# Patient Record
Sex: Male | Born: 1980 | Race: White | Hispanic: No | Marital: Single | Smoking: Never smoker
Health system: Southern US, Community
[De-identification: ages and names within clinical notes are randomized; demographics above are authoritative.]

---

## 2005-03-19 ENCOUNTER — Emergency Department: Payer: Self-pay | Admitting: Emergency Medicine

## 2005-03-20 ENCOUNTER — Emergency Department: Payer: Self-pay | Admitting: Emergency Medicine

## 2007-06-13 ENCOUNTER — Emergency Department: Payer: Self-pay | Admitting: Emergency Medicine

## 2008-02-29 ENCOUNTER — Emergency Department: Payer: Self-pay | Admitting: Emergency Medicine

## 2008-03-31 ENCOUNTER — Emergency Department: Payer: Self-pay | Admitting: Internal Medicine

## 2008-04-03 ENCOUNTER — Emergency Department: Payer: Self-pay | Admitting: Emergency Medicine

## 2009-02-20 ENCOUNTER — Ambulatory Visit: Payer: Self-pay | Admitting: General Practice

## 2009-09-17 ENCOUNTER — Emergency Department: Payer: Self-pay | Admitting: Internal Medicine

## 2010-03-02 ENCOUNTER — Emergency Department: Payer: Self-pay | Admitting: Emergency Medicine

## 2010-03-29 ENCOUNTER — Emergency Department: Payer: Self-pay | Admitting: Unknown Physician Specialty

## 2010-09-15 ENCOUNTER — Emergency Department: Payer: Self-pay | Admitting: Emergency Medicine

## 2010-09-22 ENCOUNTER — Emergency Department: Payer: Self-pay | Admitting: Emergency Medicine

## 2010-12-26 ENCOUNTER — Emergency Department: Payer: Self-pay | Admitting: Emergency Medicine

## 2011-05-28 ENCOUNTER — Emergency Department: Payer: Self-pay | Admitting: Emergency Medicine

## 2011-12-06 ENCOUNTER — Emergency Department: Payer: Self-pay | Admitting: *Deleted

## 2011-12-18 ENCOUNTER — Emergency Department: Payer: Self-pay | Admitting: Emergency Medicine

## 2012-01-22 ENCOUNTER — Emergency Department: Payer: Self-pay

## 2012-02-03 ENCOUNTER — Emergency Department: Payer: Self-pay | Admitting: Emergency Medicine

## 2012-02-03 LAB — CBC
HCT: 42.9 % (ref 40.0–52.0)
HGB: 14.7 g/dL (ref 13.0–18.0)
MCH: 31.8 pg (ref 26.0–34.0)
MCV: 93 fL (ref 80–100)
RBC: 4.64 10*6/uL (ref 4.40–5.90)
RDW: 13 % (ref 11.5–14.5)
WBC: 2.6 10*3/uL — ABNORMAL LOW (ref 3.8–10.6)

## 2012-02-03 LAB — BASIC METABOLIC PANEL
Anion Gap: 8 (ref 7–16)
Calcium, Total: 8.2 mg/dL — ABNORMAL LOW (ref 8.5–10.1)
Chloride: 99 mmol/L (ref 98–107)
Co2: 27 mmol/L (ref 21–32)
Creatinine: 1.27 mg/dL (ref 0.60–1.30)
Glucose: 116 mg/dL — ABNORMAL HIGH (ref 65–99)
Osmolality: 268 (ref 275–301)
Potassium: 3.8 mmol/L (ref 3.5–5.1)
Sodium: 134 mmol/L — ABNORMAL LOW (ref 136–145)

## 2012-02-05 ENCOUNTER — Emergency Department: Payer: Self-pay | Admitting: *Deleted

## 2012-02-05 LAB — URINALYSIS, COMPLETE
Bilirubin,UR: NEGATIVE
Blood: NEGATIVE
Glucose,UR: NEGATIVE mg/dL (ref 0–75)
Ketone: NEGATIVE
Leukocyte Esterase: NEGATIVE
RBC,UR: NONE SEEN /HPF (ref 0–5)
Specific Gravity: 1.025 (ref 1.003–1.030)
Squamous Epithelial: NONE SEEN
WBC UR: <1 /HPF (ref 0–5)

## 2012-06-07 ENCOUNTER — Emergency Department: Payer: Self-pay | Admitting: Emergency Medicine

## 2013-11-10 ENCOUNTER — Emergency Department: Payer: Self-pay | Admitting: Emergency Medicine

## 2013-11-10 LAB — CBC
HCT: 46.6 % (ref 40.0–52.0)
HGB: 15.8 g/dL (ref 13.0–18.0)
MCH: 31.2 pg (ref 26.0–34.0)
MCHC: 34 g/dL (ref 32.0–36.0)
MCV: 92 fL (ref 80–100)
Platelet: 261 10*3/uL (ref 150–440)
RBC: 5.08 10*6/uL (ref 4.40–5.90)
RDW: 13.7 % (ref 11.5–14.5)
WBC: 10.3 10*3/uL (ref 3.8–10.6)

## 2013-11-10 LAB — COMPREHENSIVE METABOLIC PANEL
ALT: 25 U/L (ref 12–78)
Albumin: 4.4 g/dL (ref 3.4–5.0)
Alkaline Phosphatase: 70 U/L
Anion Gap: 4 — ABNORMAL LOW (ref 7–16)
BUN: 15 mg/dL (ref 7–18)
Bilirubin,Total: 1.5 mg/dL — ABNORMAL HIGH (ref 0.2–1.0)
CALCIUM: 9.2 mg/dL (ref 8.5–10.1)
CO2: 32 mmol/L (ref 21–32)
CREATININE: 1.64 mg/dL — AB (ref 0.60–1.30)
Chloride: 101 mmol/L (ref 98–107)
EGFR (African American): >60
EGFR (Non-African Amer.): 54 — ABNORMAL LOW
Glucose: 121 mg/dL — ABNORMAL HIGH (ref 65–99)
Osmolality: 276 (ref 275–301)
POTASSIUM: 3.4 mmol/L — AB (ref 3.5–5.1)
SGOT(AST): 35 U/L (ref 15–37)
Sodium: 137 mmol/L (ref 136–145)
TOTAL PROTEIN: 7.5 g/dL (ref 6.4–8.2)

## 2013-11-10 LAB — URINALYSIS, COMPLETE
BACTERIA: NONE SEEN
BILIRUBIN, UR: NEGATIVE
Blood: NEGATIVE
Glucose,UR: NEGATIVE mg/dL (ref 0–75)
Leukocyte Esterase: NEGATIVE
NITRITE: NEGATIVE
Ph: 5 (ref 4.5–8.0)
Protein: 30
RBC, UR: NONE SEEN /HPF (ref 0–5)
Specific Gravity: 1.023 (ref 1.003–1.030)
Squamous Epithelial: <1
WBC UR: NONE SEEN /HPF (ref 0–5)

## 2013-11-10 LAB — DRUG SCREEN, URINE
AMPHETAMINES, UR SCREEN: NEGATIVE (ref ?–1000)
BARBITURATES, UR SCREEN: NEGATIVE (ref ?–200)
BENZODIAZEPINE, UR SCRN: POSITIVE (ref ?–200)
CANNABINOID 50 NG, UR ~~LOC~~: POSITIVE (ref ?–50)
COCAINE METABOLITE, UR ~~LOC~~: POSITIVE (ref ?–300)
MDMA (Ecstasy)Ur Screen: NEGATIVE (ref ?–500)
METHADONE, UR SCREEN: NEGATIVE (ref ?–300)
Opiate, Ur Screen: NEGATIVE (ref ?–300)
Phencyclidine (PCP) Ur S: NEGATIVE (ref ?–25)
Tricyclic, Ur Screen: NEGATIVE (ref ?–1000)

## 2013-11-10 LAB — SALICYLATE LEVEL

## 2013-11-10 LAB — ACETAMINOPHEN LEVEL: Acetaminophen: <2 ug/mL

## 2013-11-10 LAB — ETHANOL: Ethanol %: <0.003 % (ref 0.000–0.080)

## 2013-11-10 LAB — TSH: Thyroid Stimulating Horm: 6.1 u[IU]/mL — ABNORMAL HIGH
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
## 2014-12-25 ENCOUNTER — Emergency Department
Admission: EM | Admit: 2014-12-25 | Discharge: 2014-12-25 | Payer: No Typology Code available for payment source | Attending: Emergency Medicine | Admitting: Emergency Medicine

## 2014-12-25 ENCOUNTER — Emergency Department: Payer: No Typology Code available for payment source

## 2014-12-25 ENCOUNTER — Encounter: Payer: Self-pay | Admitting: Emergency Medicine

## 2014-12-25 DIAGNOSIS — S022XXA Fracture of nasal bones, initial encounter for closed fracture: Secondary | ICD-10-CM | POA: Insufficient documentation

## 2014-12-25 DIAGNOSIS — S028XXA Fractures of other specified skull and facial bones, initial encounter for closed fracture: Secondary | ICD-10-CM | POA: Insufficient documentation

## 2014-12-25 DIAGNOSIS — Y9389 Activity, other specified: Secondary | ICD-10-CM | POA: Insufficient documentation

## 2014-12-25 DIAGNOSIS — Y998 Other external cause status: Secondary | ICD-10-CM | POA: Diagnosis not present

## 2014-12-25 DIAGNOSIS — S0292XA Unspecified fracture of facial bones, initial encounter for closed fracture: Secondary | ICD-10-CM

## 2014-12-25 DIAGNOSIS — S02401A Maxillary fracture, unspecified, initial encounter for closed fracture: Secondary | ICD-10-CM | POA: Diagnosis not present

## 2014-12-25 DIAGNOSIS — Y9289 Other specified places as the place of occurrence of the external cause: Secondary | ICD-10-CM | POA: Diagnosis not present

## 2014-12-25 DIAGNOSIS — S0993XA Unspecified injury of face, initial encounter: Secondary | ICD-10-CM | POA: Diagnosis present

## 2014-12-25 DIAGNOSIS — S023XXA Fracture of orbital floor, initial encounter for closed fracture: Secondary | ICD-10-CM | POA: Insufficient documentation

## 2014-12-25 DIAGNOSIS — S02402A Zygomatic fracture, unspecified, initial encounter for closed fracture: Secondary | ICD-10-CM | POA: Diagnosis not present

## 2014-12-25 LAB — COMPREHENSIVE METABOLIC PANEL
ALBUMIN: 4.7 g/dL (ref 3.5–5.0)
ALT: 26 U/L (ref 17–63)
ANION GAP: 9 (ref 5–15)
AST: 30 U/L (ref 15–41)
Alkaline Phosphatase: 58 U/L (ref 38–126)
BILIRUBIN TOTAL: 1.5 mg/dL — AB (ref 0.3–1.2)
BUN: 16 mg/dL (ref 6–20)
CHLORIDE: 106 mmol/L (ref 101–111)
CO2: 26 mmol/L (ref 22–32)
Calcium: 9.1 mg/dL (ref 8.9–10.3)
Creatinine, Ser: 1.41 mg/dL — ABNORMAL HIGH (ref 0.61–1.24)
GFR calc Af Amer: >60 mL/min (ref >60)
GFR calc non Af Amer: >60 mL/min (ref >60)
GLUCOSE: 124 mg/dL — AB (ref 65–99)
Potassium: 4 mmol/L (ref 3.5–5.1)
SODIUM: 141 mmol/L (ref 135–145)
Total Protein: 7.4 g/dL (ref 6.5–8.1)

## 2014-12-25 LAB — CBC
HCT: 46.7 % (ref 40.0–52.0)
Hemoglobin: 16 g/dL (ref 13.0–18.0)
MCH: 30.9 pg (ref 26.0–34.0)
MCHC: 34.2 g/dL (ref 32.0–36.0)
MCV: 90.4 fL (ref 80.0–100.0)
PLATELETS: 264 10*3/uL (ref 150–440)
RBC: 5.16 MIL/uL (ref 4.40–5.90)
RDW: 14 % (ref 11.5–14.5)
WBC: 18.7 10*3/uL — ABNORMAL HIGH (ref 3.8–10.6)

## 2014-12-25 LAB — ETHANOL: Alcohol, Ethyl (B): <5 mg/dL (ref <5)

## 2014-12-25 MED ORDER — MORPHINE SULFATE 4 MG/ML IJ SOLN
INTRAMUSCULAR | Status: AC
  Administered 2014-12-25: 4 mg via INTRAVENOUS
  Filled 2014-12-25: qty 1

## 2014-12-25 MED ORDER — HYDROMORPHONE HCL 1 MG/ML IJ SOLN
INTRAMUSCULAR | Status: AC
Start: 2014-12-25 — End: 2014-12-25
  Administered 2014-12-25: 1 mg via INTRAVENOUS
  Filled 2014-12-25: qty 1

## 2014-12-25 MED ORDER — SODIUM CHLORIDE 0.9 % IV SOLN
3.0000 g | Freq: Once | INTRAVENOUS | Status: AC
  Administered 2014-12-25: 3 g via INTRAVENOUS

## 2014-12-25 MED ORDER — MORPHINE SULFATE 4 MG/ML IJ SOLN
4.0000 mg | Freq: Once | INTRAMUSCULAR | Status: AC
  Administered 2014-12-25: 4 mg via INTRAVENOUS

## 2014-12-25 MED ORDER — ONDANSETRON HCL 4 MG/2ML IJ SOLN
INTRAMUSCULAR | Status: AC
  Administered 2014-12-25: 4 mg via INTRAVENOUS
  Filled 2014-12-25: qty 2

## 2014-12-25 MED ORDER — HYDROMORPHONE HCL 1 MG/ML IJ SOLN
1.0000 mg | Freq: Once | INTRAMUSCULAR | Status: AC
  Administered 2014-12-25: 1 mg via INTRAVENOUS

## 2014-12-25 MED ORDER — AMPICILLIN-SULBACTAM SODIUM 3 (2-1) G IJ SOLR
INTRAMUSCULAR | Status: AC
  Administered 2014-12-25: 3 g via INTRAVENOUS
  Filled 2014-12-25: qty 3

## 2014-12-25 MED ORDER — SODIUM CHLORIDE 0.9 % IV BOLUS (SEPSIS)
1000.0000 mL | Freq: Once | INTRAVENOUS | Status: AC
  Administered 2014-12-25: 1000 mL via INTRAVENOUS

## 2014-12-25 MED ORDER — ONDANSETRON HCL 4 MG/2ML IJ SOLN
4.0000 mg | Freq: Once | INTRAMUSCULAR | Status: AC
Start: 2014-12-25 — End: 2014-12-25
  Administered 2014-12-25: 4 mg via INTRAVENOUS
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
## 2015-12-06 ENCOUNTER — Encounter: Payer: Self-pay | Admitting: Radiology

## 2015-12-06 ENCOUNTER — Emergency Department: Payer: Self-pay

## 2015-12-06 ENCOUNTER — Emergency Department
Admission: EM | Admit: 2015-12-06 | Discharge: 2015-12-06 | Disposition: A | Discharge status: Home / Self Care | Payer: Self-pay | Attending: Emergency Medicine | Admitting: Emergency Medicine

## 2015-12-06 DIAGNOSIS — F1210 Cannabis abuse, uncomplicated: Secondary | ICD-10-CM

## 2015-12-06 DIAGNOSIS — S20212A Contusion of left front wall of thorax, initial encounter: Secondary | ICD-10-CM | POA: Insufficient documentation

## 2015-12-06 DIAGNOSIS — S2232XA Fracture of one rib, left side, initial encounter for closed fracture: Secondary | ICD-10-CM | POA: Insufficient documentation

## 2015-12-06 DIAGNOSIS — S20229A Contusion of unspecified back wall of thorax, initial encounter: Secondary | ICD-10-CM | POA: Insufficient documentation

## 2015-12-06 DIAGNOSIS — S9031XA Contusion of right foot, initial encounter: Secondary | ICD-10-CM | POA: Insufficient documentation

## 2015-12-06 DIAGNOSIS — Y999 Unspecified external cause status: Secondary | ICD-10-CM | POA: Insufficient documentation

## 2015-12-06 DIAGNOSIS — T07XXXA Unspecified multiple injuries, initial encounter: Secondary | ICD-10-CM

## 2015-12-06 DIAGNOSIS — S0083XA Contusion of other part of head, initial encounter: Secondary | ICD-10-CM | POA: Insufficient documentation

## 2015-12-06 DIAGNOSIS — S9032XA Contusion of left foot, initial encounter: Secondary | ICD-10-CM | POA: Insufficient documentation

## 2015-12-06 DIAGNOSIS — Y939 Activity, unspecified: Secondary | ICD-10-CM | POA: Insufficient documentation

## 2015-12-06 DIAGNOSIS — Y929 Unspecified place or not applicable: Secondary | ICD-10-CM | POA: Insufficient documentation

## 2015-12-06 DIAGNOSIS — F1490 Cocaine use, unspecified, uncomplicated: Secondary | ICD-10-CM

## 2015-12-06 DIAGNOSIS — S8012XA Contusion of left lower leg, initial encounter: Secondary | ICD-10-CM | POA: Insufficient documentation

## 2015-12-06 DIAGNOSIS — S40022A Contusion of left upper arm, initial encounter: Secondary | ICD-10-CM | POA: Insufficient documentation

## 2015-12-06 LAB — COMPREHENSIVE METABOLIC PANEL
ALT: 21 U/L (ref 17–63)
ANION GAP: 4 — AB (ref 5–15)
AST: 36 U/L (ref 15–41)
Albumin: 3.9 g/dL (ref 3.5–5.0)
Alkaline Phosphatase: 53 U/L (ref 38–126)
BUN: 23 mg/dL — ABNORMAL HIGH (ref 6–20)
CO2: 28 mmol/L (ref 22–32)
Calcium: 8.7 mg/dL — ABNORMAL LOW (ref 8.9–10.3)
Chloride: 105 mmol/L (ref 101–111)
Creatinine, Ser: 1.78 mg/dL — ABNORMAL HIGH (ref 0.61–1.24)
GFR calc Af Amer: 55 mL/min — ABNORMAL LOW (ref >60)
GFR calc non Af Amer: 47 mL/min — ABNORMAL LOW (ref >60)
GLUCOSE: 86 mg/dL (ref 65–99)
Potassium: 3.6 mmol/L (ref 3.5–5.1)
SODIUM: 137 mmol/L (ref 135–145)
Total Bilirubin: 0.7 mg/dL (ref 0.3–1.2)
Total Protein: 6 g/dL — ABNORMAL LOW (ref 6.5–8.1)

## 2015-12-06 LAB — CBC WITH DIFFERENTIAL/PLATELET
BASOS PCT: 1 %
Basophils Absolute: 0.1 10*3/uL (ref 0–0.1)
EOS ABS: 0.1 10*3/uL (ref 0–0.7)
EOS PCT: 0 %
HCT: 39.5 % — ABNORMAL LOW (ref 40.0–52.0)
Hemoglobin: 13.8 g/dL (ref 13.0–18.0)
Lymphocytes Relative: 5 %
Lymphs Abs: 1 10*3/uL (ref 1.0–3.6)
MCH: 31.2 pg (ref 26.0–34.0)
MCHC: 35 g/dL (ref 32.0–36.0)
MCV: 89.3 fL (ref 80.0–100.0)
MONO ABS: 0.9 10*3/uL (ref 0.2–1.0)
MONOS PCT: 5 %
Neutro Abs: 16.2 10*3/uL — ABNORMAL HIGH (ref 1.4–6.5)
Neutrophils Relative %: 89 %
Platelets: 224 10*3/uL (ref 150–440)
RBC: 4.43 MIL/uL (ref 4.40–5.90)
RDW: 13.7 % (ref 11.5–14.5)
WBC: 18.2 10*3/uL — ABNORMAL HIGH (ref 3.8–10.6)

## 2015-12-06 LAB — URINE DRUG SCREEN, QUALITATIVE (ARMC ONLY)
Amphetamines, Ur Screen: NOT DETECTED
Barbiturates, Ur Screen: NOT DETECTED
Benzodiazepine, Ur Scrn: NOT DETECTED
Cannabinoid 50 Ng, Ur ~~LOC~~: POSITIVE — AB
Cocaine Metabolite,Ur ~~LOC~~: POSITIVE — AB
MDMA (ECSTASY) UR SCREEN: NOT DETECTED
METHADONE SCREEN, URINE: NOT DETECTED
OPIATE, UR SCREEN: NOT DETECTED
PHENCYCLIDINE (PCP) UR S: NOT DETECTED
Tricyclic, Ur Screen: NOT DETECTED

## 2015-12-06 LAB — URINALYSIS COMPLETE WITH MICROSCOPIC (ARMC ONLY)
BILIRUBIN URINE: NEGATIVE
Bacteria, UA: NONE SEEN
Glucose, UA: NEGATIVE mg/dL
HGB URINE DIPSTICK: NEGATIVE
Ketones, ur: NEGATIVE mg/dL
Leukocytes, UA: NEGATIVE
NITRITE: NEGATIVE
PH: 5 (ref 5.0–8.0)
Protein, ur: NEGATIVE mg/dL
SPECIFIC GRAVITY, URINE: 1.048 — AB (ref 1.005–1.030)
SQUAMOUS EPITHELIAL / LPF: NONE SEEN

## 2015-12-06 LAB — ETHANOL: Alcohol, Ethyl (B): <5 mg/dL (ref <5)

## 2015-12-06 LAB — PROTIME-INR
INR: 1.04
Prothrombin Time: 13.8 seconds (ref 11.4–15.0)

## 2015-12-06 MED ORDER — HYDROMORPHONE HCL 1 MG/ML IJ SOLN
INTRAMUSCULAR | Status: AC
  Administered 2015-12-06: 1 mg via INTRAVENOUS
  Filled 2015-12-06: qty 1

## 2015-12-06 MED ORDER — OXYCODONE-ACETAMINOPHEN 5-325 MG PO TABS: 1.0000 | ORAL_TABLET | ORAL | Status: AC | PRN

## 2015-12-06 MED ORDER — SODIUM CHLORIDE 0.9 % IV BOLUS (SEPSIS)
1000.0000 mL | Freq: Once | INTRAVENOUS | Status: AC
  Administered 2015-12-06: 1000 mL via INTRAVENOUS

## 2015-12-06 MED ORDER — DOCUSATE SODIUM 100 MG PO CAPS: ORAL_CAPSULE | ORAL | Status: AC

## 2015-12-06 MED ORDER — HYDROMORPHONE HCL 1 MG/ML IJ SOLN
1.0000 mg | Freq: Once | INTRAMUSCULAR | Status: AC
  Administered 2015-12-06: 1 mg via INTRAVENOUS

## 2015-12-06 MED ORDER — IOPAMIDOL (ISOVUE-370) INJECTION 76%
100.0000 mL | Freq: Once | INTRAVENOUS | Status: AC | PRN
  Administered 2015-12-06: 100 mL via INTRAVENOUS

## 2015-12-06 MED ORDER — ONDANSETRON HCL 4 MG/2ML IJ SOLN
4.0000 mg | Freq: Once | INTRAMUSCULAR | Status: AC
  Administered 2015-12-06: 4 mg via INTRAVENOUS

## 2015-12-06 MED ORDER — ONDANSETRON HCL 4 MG/2ML IJ SOLN
INTRAMUSCULAR | Status: AC
  Administered 2015-12-06: 4 mg via INTRAVENOUS
  Filled 2015-12-06: qty 2

## 2015-12-06 MED ORDER — OXYCODONE-ACETAMINOPHEN 5-325 MG PO TABS
2.0000 | ORAL_TABLET | Freq: Once | ORAL | Status: AC
  Administered 2015-12-06: 2 via ORAL
  Filled 2015-12-06: qty 2

## 2015-12-06 MED ORDER — SODIUM CHLORIDE 0.9 % IV BOLUS (SEPSIS)
1000.0000 mL | INTRAVENOUS | Status: AC
  Administered 2015-12-06: 1000 mL via INTRAVENOUS

## 2015-12-06 NOTE — ED Notes (Signed)
Patient transported to CT via stretcher.
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
## 2015-12-06 NOTE — ED Notes (Signed)
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
## 2015-12-06 NOTE — ED Notes (Signed)
Patient transported to X-ray via stretcher 

## 2017-03-11 ENCOUNTER — Encounter: Payer: Self-pay | Admitting: Emergency Medicine

## 2017-03-11 ENCOUNTER — Emergency Department
Admission: EM | Admit: 2017-03-11 | Discharge: 2017-03-11 | Disposition: A | Discharge status: Home / Self Care | Payer: Self-pay | Attending: Emergency Medicine | Admitting: Emergency Medicine

## 2017-03-11 DIAGNOSIS — M25511 Pain in right shoulder: Secondary | ICD-10-CM | POA: Insufficient documentation

## 2017-03-11 MED ORDER — KETOROLAC TROMETHAMINE 60 MG/2ML IM SOLN
60.0000 mg | Freq: Once | INTRAMUSCULAR | Status: AC
  Administered 2017-03-11: 60 mg via INTRAMUSCULAR

## 2017-03-11 MED ORDER — TRAMADOL HCL 50 MG PO TABS: 50.0000 mg | ORAL_TABLET | Freq: Four times a day (QID) | ORAL | 0 refills | Status: AC | PRN

## 2017-03-11 MED ORDER — KETOROLAC TROMETHAMINE 60 MG/2ML IM SOLN
INTRAMUSCULAR | Status: AC
  Filled 2017-03-11: qty 2

## 2017-03-11 NOTE — ED Triage Notes (Signed)
Pt ambulatory to triage without difficulty, pt very restless; st [facility] and not feeling any better; st was helping someone carry a transmission and it slipped injuring his right shoulder
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
## 2017-03-11 NOTE — ED Notes (Signed)

## 2017-03-26 ENCOUNTER — Emergency Department: Payer: Self-pay

## 2017-03-26 ENCOUNTER — Emergency Department
Admission: EM | Admit: 2017-03-26 | Discharge: 2017-03-26 | Disposition: A | Discharge status: Home / Self Care | Payer: Self-pay | Attending: Emergency Medicine | Admitting: Emergency Medicine

## 2017-03-26 ENCOUNTER — Encounter: Payer: Self-pay | Admitting: Emergency Medicine

## 2017-03-26 DIAGNOSIS — R202 Paresthesia of skin: Secondary | ICD-10-CM | POA: Insufficient documentation

## 2017-03-26 DIAGNOSIS — M5412 Radiculopathy, cervical region: Secondary | ICD-10-CM | POA: Insufficient documentation

## 2017-03-26 MED ORDER — DEXAMETHASONE SODIUM PHOSPHATE 10 MG/ML IJ SOLN
10.0000 mg | Freq: Once | INTRAMUSCULAR | Status: AC
  Administered 2017-03-26: 10 mg via INTRAMUSCULAR
  Filled 2017-03-26: qty 1

## 2017-03-26 MED ORDER — OXYCODONE-ACETAMINOPHEN 5-325 MG PO TABS: 1.0000 | ORAL_TABLET | Freq: Three times a day (TID) | ORAL | 0 refills | Status: AC | PRN

## 2017-03-26 MED ORDER — KETOROLAC TROMETHAMINE 60 MG/2ML IM SOLN
60.0000 mg | Freq: Once | INTRAMUSCULAR | Status: AC
  Administered 2017-03-26: 60 mg via INTRAMUSCULAR
  Filled 2017-03-26: qty 2

## 2017-03-26 MED ORDER — PREDNISONE 10 MG (21) PO TBPK: ORAL_TABLET | Freq: Every day | ORAL | 0 refills | Status: AC
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
## 2017-03-26 NOTE — ED Notes (Signed)
Pt taken to X-ray, visualized as ambulatory with no difficulty.

## 2017-03-26 NOTE — ED Notes (Signed)
NAD noted at time of D/C. Pt denies questions or concerns. Pt ambulatory to the lobby at this time.  

## 2017-03-26 NOTE — ED Notes (Signed)
Dr. Williams at bedside at this time.  

## 2017-03-26 NOTE — ED Triage Notes (Signed)
Patient ambulatory to triage with steady gait, without difficulty or distress noted; pt reports rt shoulder pain; has been seen multiple times for same

## 2019-06-06 IMAGING — CR DG SHOULDER 2+V*R*
3 series · 3 of 3 positions shown · non-contrast
Comparison: None.

CLINICAL DATA: Right shoulder pain after moving furniture 1 month
ago.

EXAM:
RIGHT SHOULDER - 2+ VIEW

[shoulder grashey (1 of 2)]
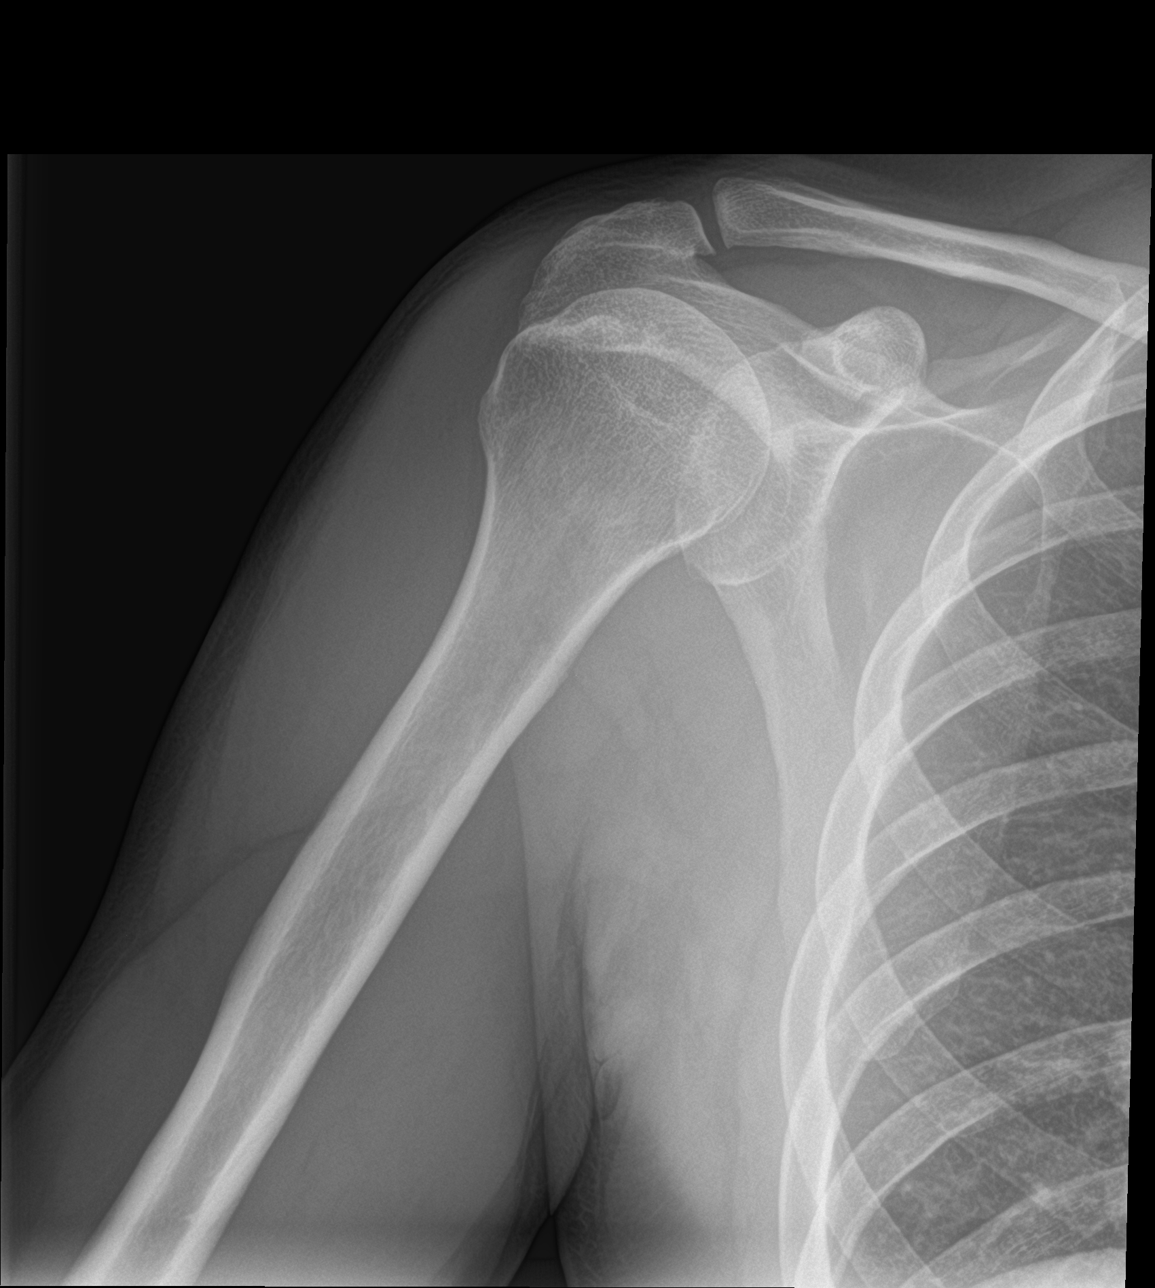

[shoulder y view]
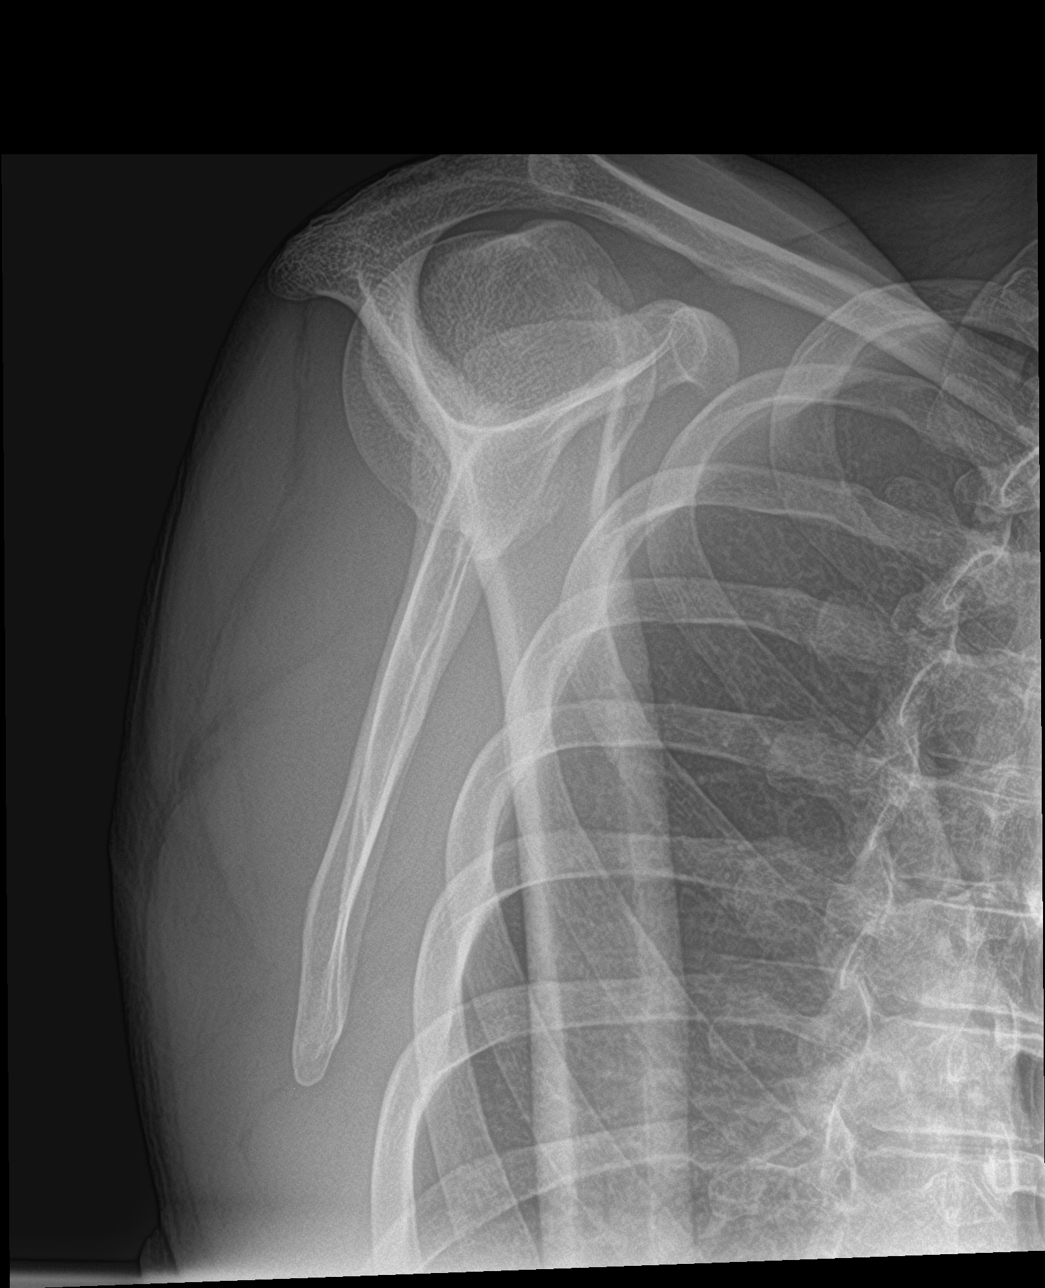

[shoulder grashey (2 of 2)]
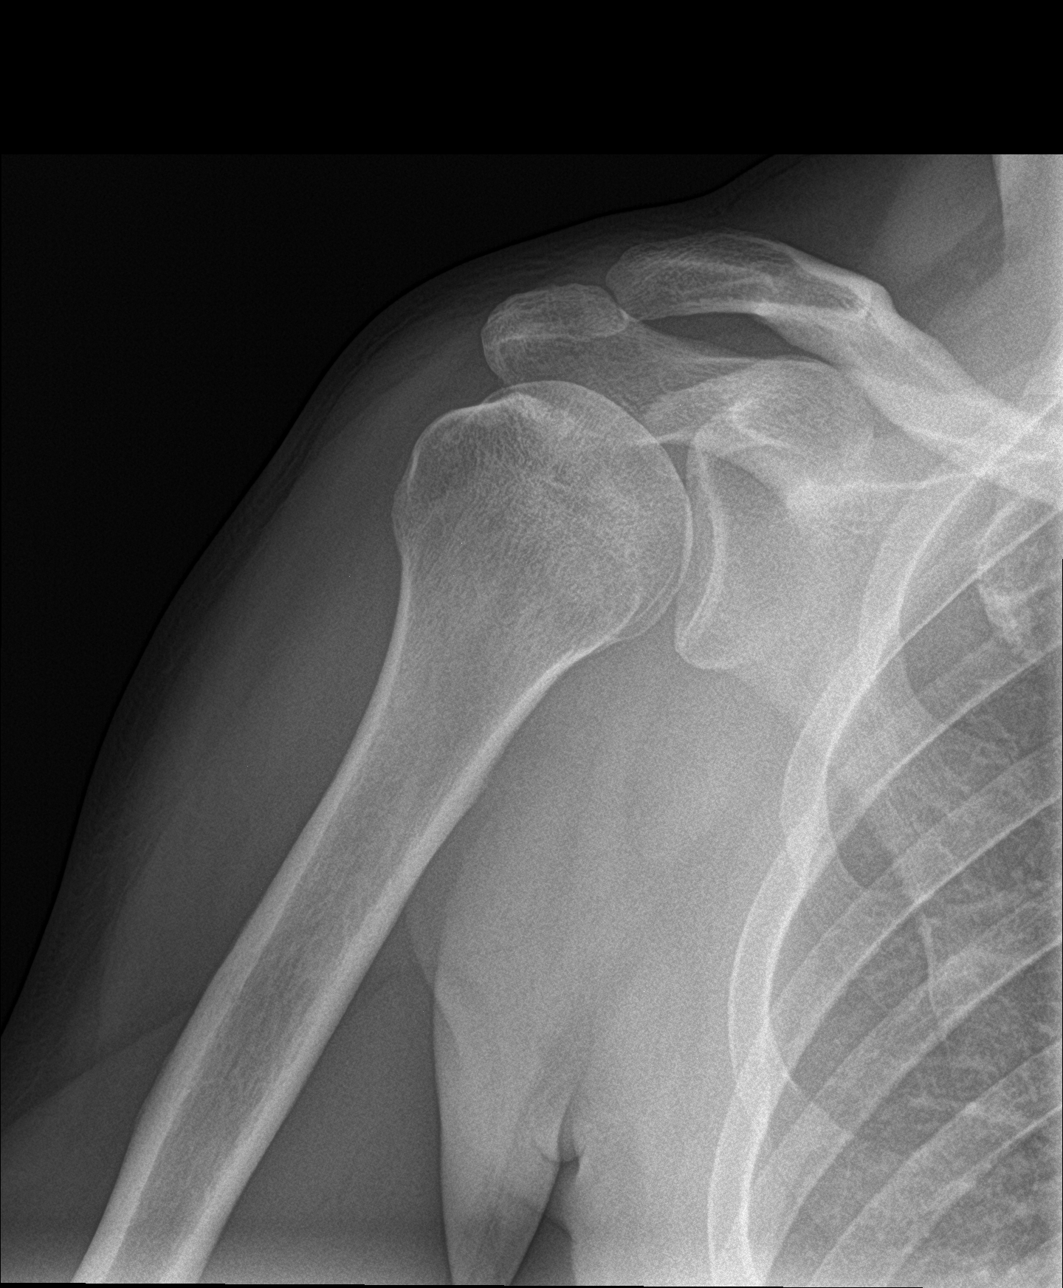

[3 of 3 positions shown; findings below may reference images not displayed]

FINDINGS: There is no evidence of fracture or dislocation. There is no
evidence of arthropathy or other focal bone abnormality. Soft
tissues are unremarkable.
IMPRESSION: Normal right shoulder.

## 2020-03-08 ENCOUNTER — Other Ambulatory Visit: Payer: Self-pay

## 2020-03-08 ENCOUNTER — Emergency Department
Admission: EM | Admit: 2020-03-08 | Discharge: 2020-03-08 | Disposition: A | Discharge status: Home / Self Care | Payer: Self-pay | Attending: Emergency Medicine | Admitting: Emergency Medicine

## 2020-03-08 ENCOUNTER — Encounter: Payer: Self-pay | Admitting: Emergency Medicine

## 2020-03-08 DIAGNOSIS — S0096XA Insect bite (nonvenomous) of unspecified part of head, initial encounter: Secondary | ICD-10-CM | POA: Insufficient documentation

## 2020-03-08 DIAGNOSIS — W57XXXA Bitten or stung by nonvenomous insect and other nonvenomous arthropods, initial encounter: Secondary | ICD-10-CM | POA: Insufficient documentation

## 2020-03-08 DIAGNOSIS — Y999 Unspecified external cause status: Secondary | ICD-10-CM | POA: Insufficient documentation

## 2020-03-08 DIAGNOSIS — Y939 Activity, unspecified: Secondary | ICD-10-CM | POA: Insufficient documentation

## 2020-03-08 DIAGNOSIS — Y929 Unspecified place or not applicable: Secondary | ICD-10-CM | POA: Insufficient documentation

## 2020-03-08 DIAGNOSIS — Z5321 Procedure and treatment not carried out due to patient leaving prior to being seen by health care provider: Secondary | ICD-10-CM | POA: Insufficient documentation

## 2020-03-08 NOTE — ED Triage Notes (Signed)
Pt in via EMS from home with c/o bugs living inside of his body. Pt was seen at [facility] yesterday for the same. Pt also claws at skin to get the bugs out.

## 2020-03-08 NOTE — ED Triage Notes (Addendum)
C/O "something" moved from nose and ears and into lungs.  Patient states he coughed some of 'it' up, now states something is 'stuck' in esophagus.  Also c/o "something" in left ear and left side of nose.  Voice clear and strong.  NAD.  Patient is anxious.    Seen through [facility] yesterday for same.

## 2020-03-08 NOTE — ED Triage Notes (Signed)
Pt states he has bugs that when he smashes and tries to get them out from under his skin he can feel them going deeper, pt has insect/scratches/bites all over, including his face. Pt states "yall are going to think im crazy".
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
## 2020-03-08 NOTE — ED Notes (Signed)
No answer when called several times from lobby 

## 2020-03-09 ENCOUNTER — Telehealth: Payer: Self-pay | Admitting: Emergency Medicine

## 2020-03-09 NOTE — Telephone Encounter (Signed)
Called patient due to lwot to inquire about condition and follow up plans. Left message.
